# Patient Record
Sex: Male | Born: 1995 | Hispanic: Yes | Marital: Married | Smoking: Never smoker
Health system: Southern US, Community
[De-identification: ages and names within clinical notes are randomized; demographics above are authoritative.]

---
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
## 2014-01-13 ENCOUNTER — Emergency Department: Payer: Self-pay | Admitting: Emergency Medicine

## 2014-01-13 LAB — COMPREHENSIVE METABOLIC PANEL
ALBUMIN: 4.2 g/dL (ref 3.8–5.6)
Alkaline Phosphatase: 125 U/L — ABNORMAL HIGH
Anion Gap: 3 — ABNORMAL LOW (ref 7–16)
BILIRUBIN TOTAL: 0.6 mg/dL (ref 0.2–1.0)
BUN: 11 mg/dL (ref 7–18)
CHLORIDE: 105 mmol/L (ref 98–107)
CO2: 31 mmol/L (ref 21–32)
Calcium, Total: 9.1 mg/dL (ref 9.0–10.7)
Creatinine: 0.86 mg/dL (ref 0.60–1.30)
EGFR (African American): >60
EGFR (Non-African Amer.): >60
Glucose: 102 mg/dL — ABNORMAL HIGH (ref 65–99)
Osmolality: 277 (ref 275–301)
Potassium: 4 mmol/L (ref 3.5–5.1)
SGOT(AST): 18 U/L (ref 10–41)
SGPT (ALT): 21 U/L
Sodium: 139 mmol/L (ref 136–145)
TOTAL PROTEIN: 7.4 g/dL (ref 6.4–8.6)

## 2014-01-13 LAB — URINALYSIS, COMPLETE
BILIRUBIN, UR: NEGATIVE
BLOOD: NEGATIVE
Bacteria: NONE SEEN
Glucose,UR: NEGATIVE mg/dL (ref 0–75)
Ketone: NEGATIVE
Leukocyte Esterase: NEGATIVE
Nitrite: NEGATIVE
Ph: 6 (ref 4.5–8.0)
Protein: NEGATIVE
RBC, UR: NONE SEEN /HPF (ref 0–5)
SPECIFIC GRAVITY: 1.018 (ref 1.003–1.030)
Squamous Epithelial: NONE SEEN
WBC UR: NONE SEEN /HPF (ref 0–5)

## 2014-01-13 LAB — CBC WITH DIFFERENTIAL/PLATELET
BASOS ABS: 0.1 10*3/uL (ref 0.0–0.1)
BASOS PCT: 1.1 %
Eosinophil #: 0.2 10*3/uL (ref 0.0–0.7)
Eosinophil %: 3 %
HCT: 46.2 % (ref 40.0–52.0)
HGB: 15.7 g/dL (ref 13.0–18.0)
Lymphocyte #: 2 10*3/uL (ref 1.0–3.6)
Lymphocyte %: 40.2 %
MCH: 29 pg (ref 26.0–34.0)
MCHC: 33.9 g/dL (ref 32.0–36.0)
MCV: 86 fL (ref 80–100)
Monocyte #: 0.4 x10 3/mm (ref 0.2–1.0)
Monocyte %: 7.2 %
NEUTROS ABS: 2.4 10*3/uL (ref 1.4–6.5)
NEUTROS PCT: 48.5 %
PLATELETS: 250 10*3/uL (ref 150–440)
RBC: 5.4 10*6/uL (ref 4.40–5.90)
RDW: 12.9 % (ref 11.5–14.5)
WBC: 4.9 10*3/uL (ref 3.8–10.6)

## 2014-01-13 LAB — LIPASE, BLOOD: Lipase: 132 U/L (ref 73–393)

## 2014-05-28 IMAGING — CR DG KNEE COMPLETE 4+V*R*
1 series · 4 of 4 positions shown · non-contrast
Comparison: none

REASON FOR EXAM: knee pain  please fax result to [phone]
COMMENTS:

PROCEDURE:     DXR - DXR KNEE RT COMP WITH OBLIQUES  - December 27, 2011  [DATE]
RESULT:     Right knee images demonstrate no fracture, dislocation or
radiopaque foreign body.

[Series 1: t knee ap right · 0.14mm/px · 4 of 4 slices shown]
[im 1/4]
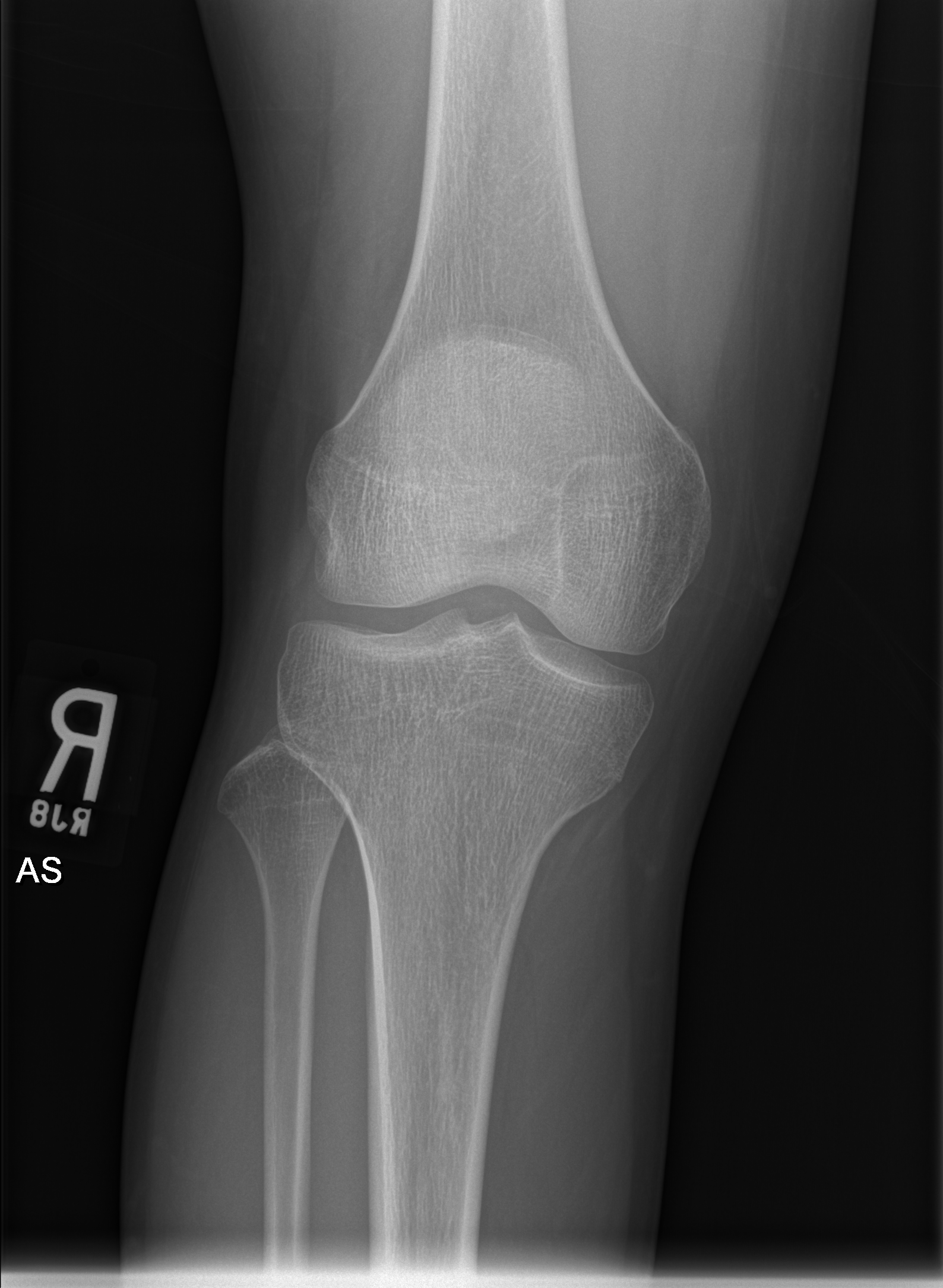
[im 2/4]
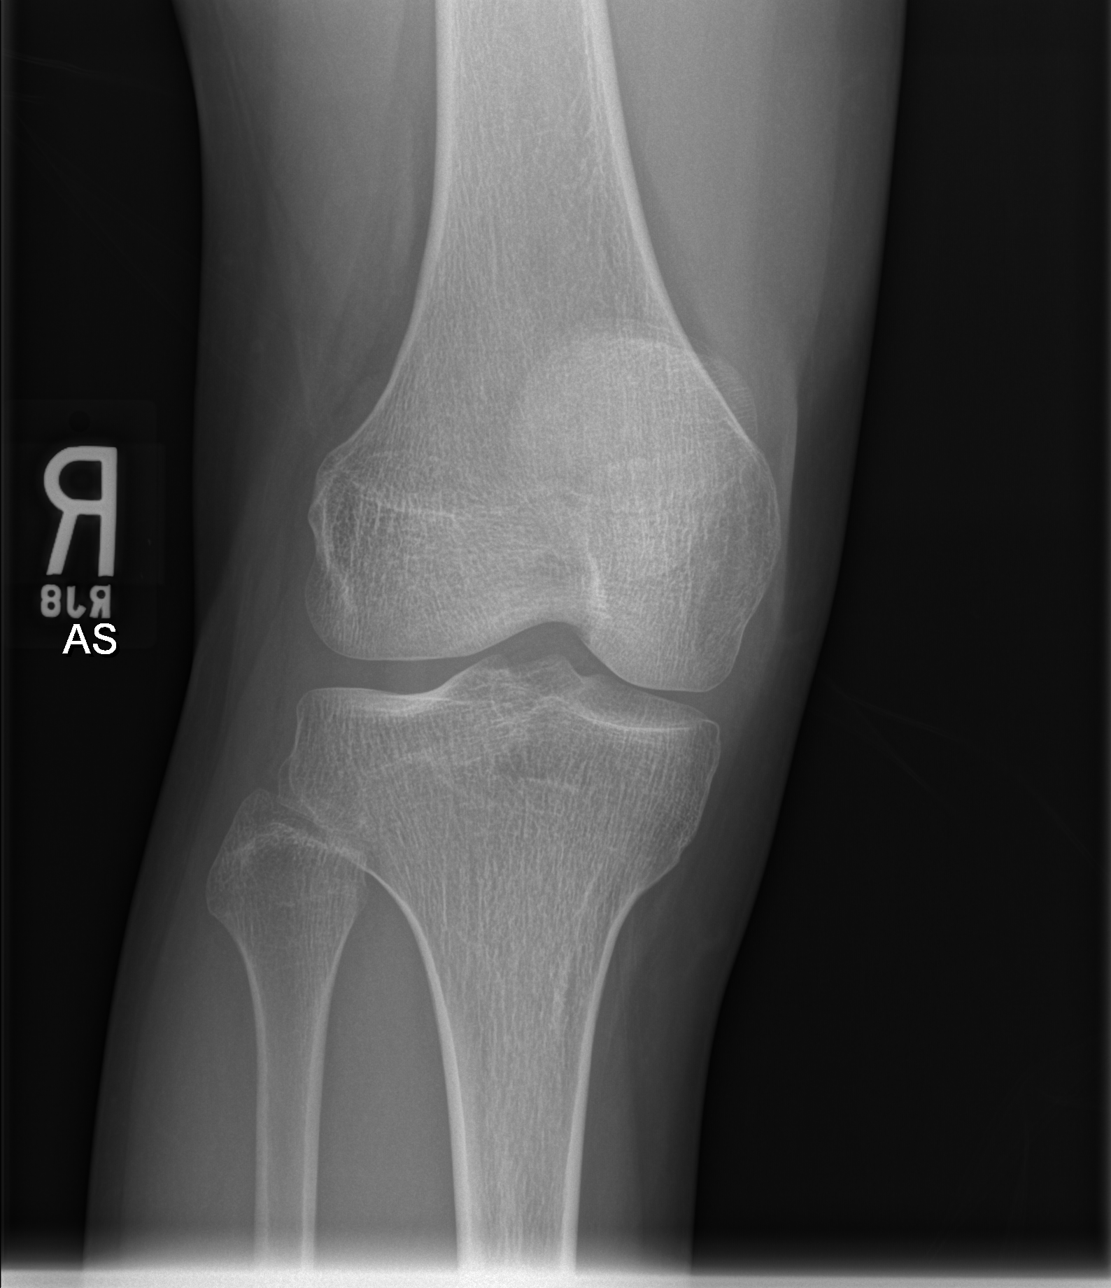
[im 3/4]
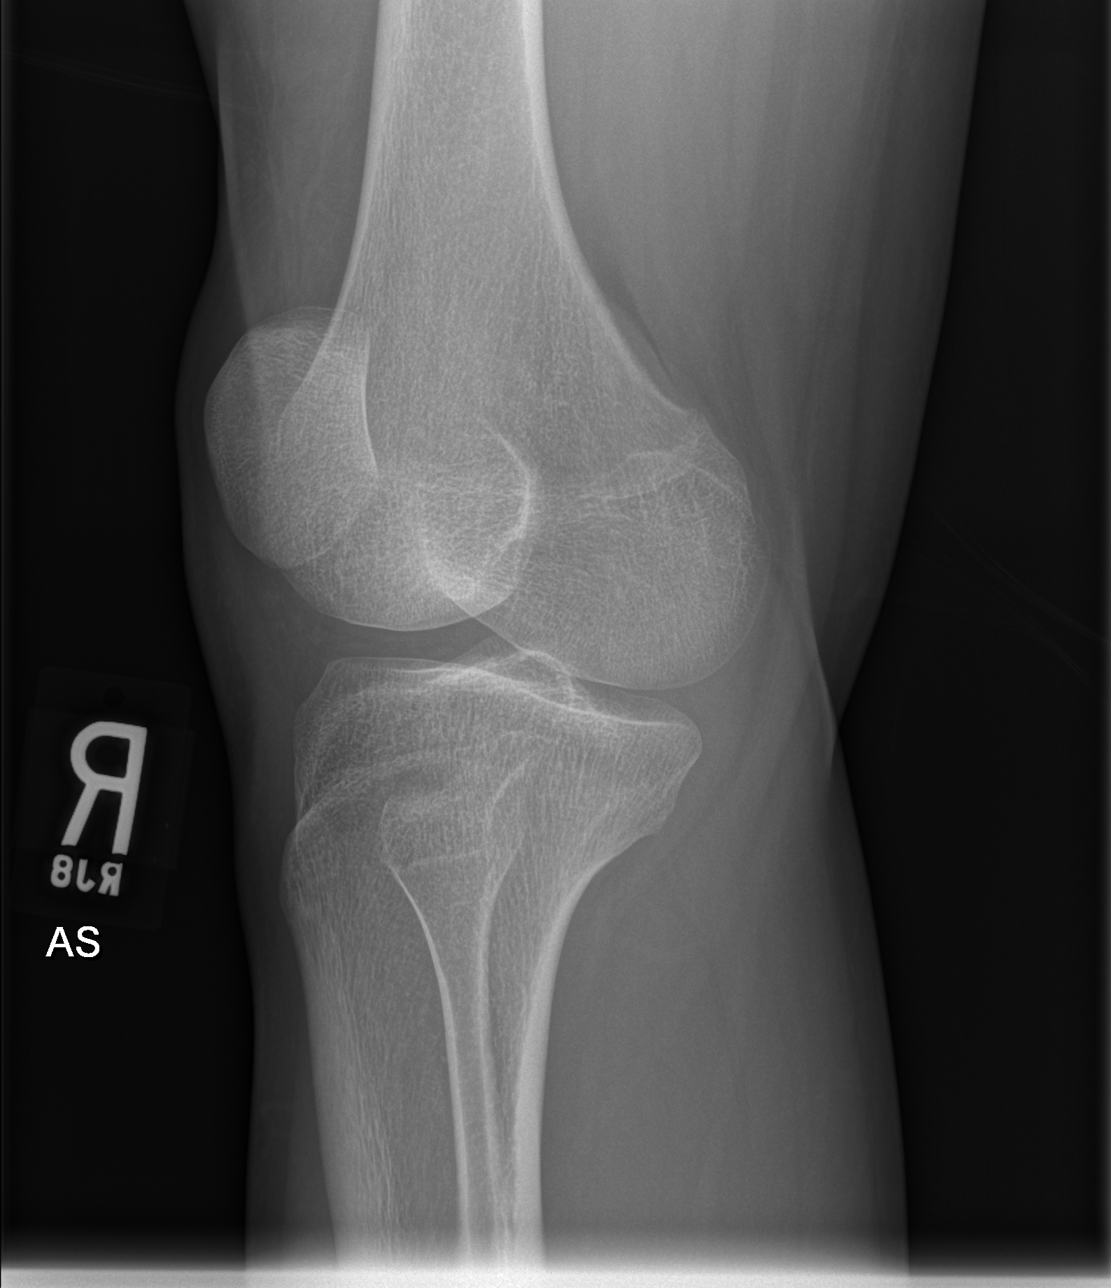
[im 4/4]
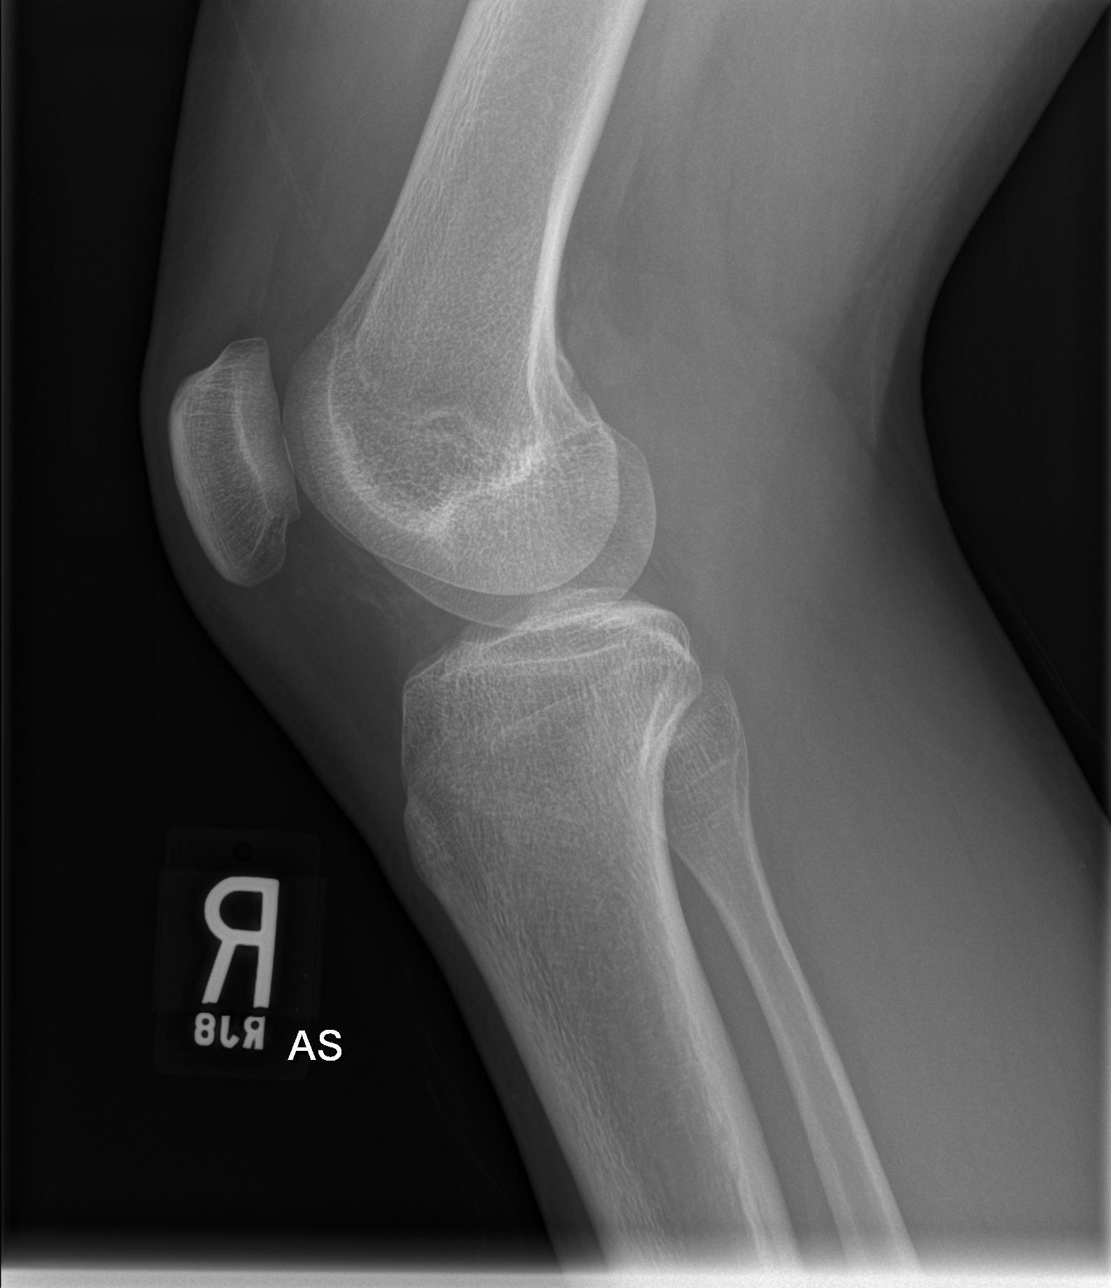

[4 of 4 positions shown; findings below may reference images not displayed]

IMPRESSION: Please see above.

[REDACTED]

## 2019-07-24 ENCOUNTER — Ambulatory Visit: Payer: Self-pay | Admitting: Dermatology

## 2021-11-23 ENCOUNTER — Ambulatory Visit: Payer: BC Managed Care – PPO | Admitting: Dermatology

## 2021-11-23 DIAGNOSIS — L80 Vitiligo: Secondary | ICD-10-CM | POA: Diagnosis not present

## 2021-11-23 MED ORDER — OPZELURA 1.5 % EX CREA: TOPICAL_CREAM | CUTANEOUS | 2 refills | Status: DC

## 2021-11-23 MED ORDER — CLOBETASOL PROPIONATE 0.05 % EX FOAM: Freq: Two times a day (BID) | CUTANEOUS | 2 refills | Status: DC

## 2021-11-23 NOTE — Progress Notes (Signed)
   Follow-Up Visit   Subjective  Jon Hamilton is a 26 y.o. male who presents for the following: Vitiligo (Knees, hands, elbows, feet x years. In the past he has used halobetasol foam, calcipotriene cream, Elidel cream, clobetasol, tacrolimus, and tofacitinib cream. He has also had Xtrac treatments in the past, but he doesn't think it helped much. ).   The following portions of the chart were reviewed this encounter and updated as appropriate:       Review of Systems:  No other skin or systemic complaints except as noted in HPI or Assessment and Plan.  Objective  Well appearing patient in no apparent distress; mood and affect are within normal limits.  A focused examination was performed including face, hands, knees, feet. Relevant physical exam findings are noted in the Assessment and Plan.  knees, wrists, feet, hands, elbows Depigmented patches                             Assessment & Plan  Vitiligo knees, wrists, feet, hands, elbows  Chronic and persistent condition with duration or expected duration over one year. Condition is symptomatic/ bothersome to patient. Not currently at goal.   Vitiligo is a chronic autoimmune condition which causes loss of skin pigment and is commonly seen on the face and may also involve areas of trauma like hands, elbows, knees, and ankles. There is no cure and it is difficult to treat.  Treatments include topical steroids and other topical anti-inflammatory ointments/creams and topical and oral Jak inhibitors.  Sometimes narrow band UV light therapy or Xtrac laser is helpful, both of which require twice weekly treatments for at least 3-6 months.  Antioxidant vitamins, such as Vitamins A,C,E,D, Folic Acid and B12 may be added to enhance treatment.  Start Clobetasol Foam Apply to AA qd vitiligo Mon-Fri, off on the weekends dsp 50g Start Opzelura Cream Apply to AA vitiligo QD, may increase to BID on the weekends dsp 60g Rxs sent  to [facility].  Topical steroids (such as triamcinolone, fluocinolone, fluocinonide, mometasone, clobetasol, halobetasol, betamethasone, hydrocortisone) can cause thinning and lightening of the skin if they are used for too long in the same area. Your physician has selected the right strength medicine for your problem and area affected on the body. Please use your medication only as directed by your physician to prevent side effects.   Discussed restarting Xtrac laser- pt defers, will start with topicals.   Related Medications clobetasol (OLUX) 0.05 % topical foam Apply topically 2 (two) times daily. Apply to affected areas vitiligo once a day Mon - Fri. Avoid face, groin, axilla.  Ruxolitinib Phosphate (OPZELURA) 1.5 % CREA Apply to vitiligo once a day, may increase to twice daily on the weekends.   Return in about 3 months (around 02/22/2022) for vitiligo.  ICherlyn Labella, CMA, am acting as scribe for Willeen Niece, MD .  Documentation: I have reviewed the above documentation for accuracy and completeness, and I agree with the above.  Willeen Niece MD

## 2021-11-23 NOTE — Patient Instructions (Addendum)
Vitiligo is a chronic autoimmune condition which causes loss of skin pigment and is commonly seen on the face and may also involve areas of trauma like hands, elbows, knees, and ankles. There is no cure and it is difficult to treat.  Treatments include topical steroids and other topical anti-inflammatory ointments/creams and topical and oral Jak inhibitors.  Sometimes narrow band UV light therapy or Xtrac laser is helpful, both of which require twice weekly treatments for at least 3-6 months.  Antioxidant vitamins, such as Vitamins A,C,E,D, Folic Acid and B12 may be added to enhance treatment.  Start Clobetasol foam Apply to vitiligo once a day Mon - Fri, off on the weekends. Avoid face, groin, underarms.  Topical steroids (such as triamcinolone, fluocinolone, fluocinonide, mometasone, clobetasol, halobetasol, betamethasone, hydrocortisone) can cause thinning and lightening of the skin if they are used for too long in the same area. Your physician has selected the right strength medicine for your problem and area affected on the body. Please use your medication only as directed by your physician to prevent side effects.   Start Opzelura Cream Apply to vitiligo once a day, may increase to twice a day on the weekends.   Due to recent changes in healthcare laws, you may see results of your pathology and/or laboratory studies on MyChart before the doctors have had a chance to review them. We understand that in some cases there may be results that are confusing or concerning to you. Please understand that not all results are received at the same time and often the doctors may need to interpret multiple results in order to provide you with the best plan of care or course of treatment. Therefore, we ask that you please give [place] 2 business days to thoroughly review all your results before contacting the office for clarification. Should we see a critical lab result, you will be contacted sooner.   If You Need  Anything After Your Visit  If you have any questions or concerns for your doctor, please call our main line at [phone] and press option 4 to reach your doctor's medical assistant. If no one answers, please leave a voicemail as directed and we will return your call as soon as possible. Messages left after 4 pm will be answered the following business day.   You may also send [place] a message via MyChart. We typically respond to MyChart messages within 1-2 business days.  For prescription refills, please ask your pharmacy to contact our office. Our fax number is [phone].  If you have an urgent issue when the clinic is closed that cannot wait until the next business day, you can page your doctor at the number below.    Please note that while we do our best to be available for urgent issues outside of office hours, we are not available 24/7.   If you have an urgent issue and are unable to reach [place], you may choose to seek medical care at your doctor's office, retail clinic, urgent care center, or emergency room.  If you have a medical emergency, please immediately call 911 or go to the emergency department.  Pager Numbers  - Dr. Gwen Pounds: [phone]  - Dr. Neale Burly: [phone]  - Dr. Roseanne Reno: [phone]  In the event of inclement weather, please call our main line at [phone] for an update on the status of any delays or closures.  Dermatology Medication Tips: Please keep the boxes that topical medications come in in order to help keep track of the  instructions about where and how to use these. Pharmacies typically print the medication instructions only on the boxes and not directly on the medication tubes.   If your medication is too expensive, please contact our office at [phone] option 4 or send [place] a message through MyChart.   We are unable to tell what your co-pay for medications will be in advance as this is different depending on your insurance coverage. However, we may  be able to find a substitute medication at lower cost or fill out paperwork to get insurance to cover a needed medication.   If a prior authorization is required to get your medication covered by your insurance company, please allow [place] 1-2 business days to complete this process.  Drug prices often vary depending on where the prescription is filled and some pharmacies may offer cheaper prices.  The website www.goodrx.com contains coupons for medications through different pharmacies. The prices here do not account for what the cost may be with help from insurance (it may be cheaper with your insurance), but the website can give you the price if you did not use any insurance.  - You can print the associated coupon and take it with your prescription to the pharmacy.  - You may also stop by our office during regular business hours and pick up a GoodRx coupon card.  - If you need your prescription sent electronically to a different pharmacy, notify our office through [facility] or by phone at [phone] option 4.     Si Usted Necesita Algo Despus de Su Visita  Tambin puede enviarnos un mensaje a travs de Clinical cytogeneticist. Por lo general respondemos a los mensajes de MyChart en el transcurso de 1 a 2 das hbiles.  Para renovar recetas, por favor pida a su farmacia que se ponga en contacto con nuestra oficina. Annie Sable de fax es [place] [phone].  Si tiene un asunto urgente cuando la clnica est cerrada y que no puede esperar hasta el siguiente da hbil, puede llamar/localizar a su doctor(a) al nmero que aparece a continuacin.   Por favor, tenga en cuenta que aunque hacemos todo lo posible para estar disponibles para asuntos urgentes fuera del horario de [place], no estamos disponibles las 24 horas del da, los 7 [address]  [address] de la [place].   Si tiene un problema urgente y no puede comunicarse con nosotros, puede optar por buscar atencin mdica  en el consultorio de su doctor(a), en una  clnica privada, en un centro de atencin urgente o en una sala de emergencias.  Si tiene Engineer, drilling, por favor llame inmediatamente al 911 o vaya a la sala de emergencias.  Nmeros de bper  - Dr. Gwen Pounds: [phone]  - Dra. Moye: [phone]  - Dra. Roseanne Reno: [phone]  En caso de inclemencias del [place], por favor llame a Lacy Duverney principal al [phone] para una actualizacin sobre el [place] de cualquier retraso o cierre.  Consejos para la medicacin en dermatologa: Por favor, guarde las cajas en las que vienen los medicamentos de uso tpico para ayudarle a seguir las instrucciones sobre dnde y cmo usarlos. Las farmacias generalmente imprimen las instrucciones del medicamento slo en las cajas y no directamente en los tubos del [place].   Si su medicamento es muy caro, por favor, pngase en contacto con Rolm Gala llamando al [phone] y presione la opcin 4 o envenos un mensaje a travs de Clinical cytogeneticist.   No podemos decirle cul ser su copago por los medicamentos por adelantado ya  que esto es diferente dependiendo de la cobertura de su seguro. Sin embargo, es posible que podamos encontrar un medicamento sustituto a Electrical engineer un formulario para que el seguro cubra el medicamento que se considera necesario.   Si se requiere una autorizacin previa para que su compaa de seguros [place] su medicamento, por favor permtanos de 1 a 2 das hbiles para completar este proceso.  Los precios de los medicamentos varan con frecuencia dependiendo del Environmental consultant de dnde se surte la receta y alguna farmacias pueden ofrecer precios ms baratos.  El sitio web www.goodrx.com tiene cupones para medicamentos de Airline pilot. Los precios aqu no tienen en cuenta lo que podra costar con la ayuda del seguro (puede ser ms barato con su seguro), pero el sitio web puede darle el precio si no utiliz Research scientist (physical sciences).  - Puede imprimir el cupn correspondiente y  llevarlo con su receta a la farmacia.  - Tambin puede pasar por nuestra oficina durante el horario de atencin regular y Charity fundraiser una tarjeta de cupones de GoodRx.  - Si necesita que su receta se enve electrnicamente a una farmacia diferente, informe a nuestra oficina a travs de MyChart de [place] o por telfono llamando al [phone] y presione la opcin 4.

## 2022-02-21 ENCOUNTER — Ambulatory Visit: Payer: BC Managed Care – PPO | Admitting: Dermatology

## 2022-03-30 ENCOUNTER — Other Ambulatory Visit: Payer: Self-pay

## 2022-03-30 ENCOUNTER — Other Ambulatory Visit: Payer: Self-pay | Admitting: Family Medicine

## 2022-03-30 DIAGNOSIS — N50811 Right testicular pain: Secondary | ICD-10-CM

## 2022-03-31 ENCOUNTER — Ambulatory Visit
Admission: RE | Admit: 2022-03-31 | Discharge: 2022-03-31 | Disposition: A | Discharge status: Home / Self Care | Payer: BC Managed Care – PPO | Source: Ambulatory Visit | Attending: Family Medicine | Admitting: Family Medicine

## 2022-03-31 DIAGNOSIS — N50811 Right testicular pain: Secondary | ICD-10-CM | POA: Diagnosis not present

## 2022-03-31 DIAGNOSIS — N50812 Left testicular pain: Secondary | ICD-10-CM | POA: Insufficient documentation

## 2022-04-10 ENCOUNTER — Ambulatory Visit: Payer: BC Managed Care – PPO | Admitting: Dermatology

## 2022-04-10 DIAGNOSIS — L80 Vitiligo: Secondary | ICD-10-CM | POA: Diagnosis not present

## 2022-04-10 NOTE — Patient Instructions (Addendum)
Clobetasol Foam - Apply to vitiligo once a day Mon-Fri. Avoid face, groin, underarms.  Topical steroids (such as triamcinolone, fluocinolone, fluocinonide, mometasone, clobetasol, halobetasol, betamethasone, hydrocortisone) can cause thinning and lightening of the skin if they are used for too long in the same area. Your physician has selected the right strength medicine for your problem and area affected on the body. Please use your medication only as directed by your physician to prevent side effects.   Opzelura Cream - Apply to vitiligo once a day, may increase to twice a day on weekends.  Vitiligo is a chronic autoimmune condition which causes loss of skin pigment and is commonly seen on the face and may also involve areas of trauma like hands, elbows, knees, and ankles. There is no cure and it is difficult to treat.  Treatments include topical steroids and other topical anti-inflammatory ointments/creams and topical and oral Jak inhibitors.  Sometimes narrow band UV light therapy or Xtrac laser is helpful, both of which require twice weekly treatments for at least 3-6 months.  Antioxidant vitamins, such as Vitamins A,C,E,D, Folic Acid and B12 may be added to enhance treatment. C E Alpha Lipoic   Due to recent changes in healthcare laws, you may see results of your pathology and/or laboratory studies on MyChart before the doctors have had a chance to review them. We understand that in some cases there may be results that are confusing or concerning to you. Please understand that not all results are received at the same time and often the doctors may need to interpret multiple results in order to provide you with the best plan of care or course of treatment. Therefore, we ask that you please give [place] 2 business days to thoroughly review all your results before contacting the office for clarification. Should we see a critical lab result, you will be contacted sooner.   If You Need Anything After Your  Visit  If you have any questions or concerns for your doctor, please call our main line at [phone] and press option 4 to reach your doctor's medical assistant. If no one answers, please leave a voicemail as directed and we will return your call as soon as possible. Messages left after 4 pm will be answered the following business day.   You may also send [place] a message via [place]. We typically respond to MyChart messages within 1-2 business days.  For prescription refills, please ask your pharmacy to contact our office. Our fax number is [phone].  If you have an urgent issue when the clinic is closed that cannot wait until the next business day, you can page your doctor at the number below.    Please note that while we do our best to be available for urgent issues outside of office hours, we are not available 24/7.   If you have an urgent issue and are unable to reach [place], you may choose to seek medical care at your doctor's office, retail clinic, urgent care center, or emergency room.  If you have a medical emergency, please immediately call 911 or go to the emergency department.  Pager Numbers  - Dr. Nehemiah Massed: [phone]  - Dr. Laurence Ferrari: [phone]  - Dr. Nicole Kindred: [phone]  In the event of inclement weather, please call our main line at [phone] for an update on the status of any delays or closures.  Dermatology Medication Tips: Please keep the boxes that topical medications come in in order to help keep track of the instructions about where  and how to use these. Pharmacies typically print the medication instructions only on the boxes and not directly on the medication tubes.   If your medication is too expensive, please contact our office at [phone] option 4 or send [place] a message through [place].   We are unable to tell what your co-pay for medications will be in advance as this is different depending on your insurance coverage. However, we may be able to find a  substitute medication at lower cost or fill out paperwork to get insurance to cover a needed medication.   If a prior authorization is required to get your medication covered by your insurance company, please allow [place] 1-2 business days to complete this process.  Drug prices often vary depending on where the prescription is filled and some pharmacies may offer cheaper prices.  The website www.goodrx.com contains coupons for medications through different pharmacies. The prices here do not account for what the cost may be with help from insurance (it may be cheaper with your insurance), but the website can give you the price if you did not use any insurance.  - You can print the associated coupon and take it with your prescription to the pharmacy.  - You may also stop by our office during regular business hours and pick up a GoodRx coupon card.  - If you need your prescription sent electronically to a different pharmacy, notify our office through [facility] or by phone at [phone] option 4.     Si Usted Necesita Algo Despus de Su Visita  Tambin puede enviarnos un mensaje a travs de Pharmacist, community. Por lo general respondemos a los mensajes de MyChart en el transcurso de 1 a 2 das hbiles.  Para renovar recetas, por favor pida a su farmacia que se ponga en contacto con nuestra oficina. Harland Dingwall de fax es [place] [phone].  Si tiene un asunto urgente cuando la clnica est cerrada y que no puede esperar hasta el siguiente da hbil, puede llamar/localizar a su doctor(a) al nmero que aparece a continuacin.   Por favor, tenga en cuenta que aunque hacemos todo lo posible para estar disponibles para asuntos urgentes fuera del horario de [place], no estamos disponibles las 24 horas del da, los 7 das de la [place].   Si tiene un problema urgente y no puede comunicarse con nosotros, puede optar por buscar atencin mdica  en el consultorio de su doctor(a), en una clnica privada, en un  centro de atencin urgente o en una sala de emergencias.  Si tiene Engineering geologist, por favor llame inmediatamente al 911 o vaya a la sala de emergencias.  Nmeros de bper  - Dr. Nehemiah Massed: [phone]  - Dra. Moye: [phone]  - Dra. Nicole Kindred: [phone]  En caso de inclemencias del [place], por favor llame a Johnsie Kindred principal al [phone] para una actualizacin sobre el [place] de cualquier retraso o cierre.  Consejos para la medicacin en dermatologa: Por favor, guarde las cajas en las que vienen los medicamentos de uso tpico para ayudarle a seguir las instrucciones sobre dnde y cmo usarlos. Las farmacias generalmente imprimen las instrucciones del medicamento slo en las cajas y no directamente en los tubos del [place].   Si su medicamento es muy caro, por favor, pngase en contacto con Zigmund Daniel llamando al [phone] y presione la opcin 4 o envenos un mensaje a travs de Pharmacist, community.   No podemos decirle cul ser su copago por los medicamentos por adelantado ya que esto es  diferente dependiendo de la cobertura de su seguro. Sin embargo, es posible que podamos encontrar un medicamento sustituto a Electrical engineer un formulario para que el seguro cubra el medicamento que se considera necesario.   Si se requiere una autorizacin previa para que su compaa de seguros [place] su medicamento, por favor permtanos de 1 a 2 das hbiles para completar este proceso.  Los precios de los medicamentos varan con frecuencia dependiendo del Environmental consultant de dnde se surte la receta y alguna farmacias pueden ofrecer precios ms baratos.  El sitio web www.goodrx.com tiene cupones para medicamentos de Airline pilot. Los precios aqu no tienen en cuenta lo que podra costar con la ayuda del seguro (puede ser ms barato con su seguro), pero el sitio web puede darle el precio si no utiliz Research scientist (physical sciences).  - Puede imprimir el cupn correspondiente y llevarlo con su receta  a la farmacia.  - Tambin puede pasar por nuestra oficina durante el horario de atencin regular y Charity fundraiser una tarjeta de cupones de GoodRx.  - Si necesita que su receta se enve electrnicamente a una farmacia diferente, informe a nuestra oficina a travs de MyChart de [place] o por telfono llamando al [phone] y presione la opcin 4.

## 2022-04-10 NOTE — Progress Notes (Signed)
   Follow-Up Visit   Subjective  Jon Hamilton is a 27 y.o. male who presents for the following: Follow-up.  Patient presents for 4 month follow-up vitiligo of the hands, arms, legs, feet. Thinks he's doing a little better.  The following portions of the chart were reviewed this encounter and updated as appropriate:       Review of Systems:  No other skin or systemic complaints except as noted in HPI or Assessment and Plan.  Objective  Well appearing patient in no apparent distress; mood and affect are within normal limits.  A focused examination was performed including face, arms, legs. Relevant physical exam findings are noted in the Assessment and Plan.  knees, wrists, feet, hands, elbows Depigmented patches with improvement when compared to photos, knees and elbows patches are smaller, hands patches not as white.    Assessment & Plan  Vitiligo knees, wrists, feet, hands, elbows  Chronic and persistent condition with duration or expected duration over one year. Condition is symptomatic / bothersome to patient. Not to goal, but improving.  Vitiligo is a chronic autoimmune condition which causes loss of skin pigment and is commonly seen on the face and may also involve areas of trauma like hands, elbows, knees, and ankles. There is no cure and it is difficult to treat.  Treatments include topical steroids and other topical anti-inflammatory ointments/creams and topical and oral Jak inhibitors.  Sometimes narrow band UV light therapy or Xtrac laser is helpful, both of which require twice weekly treatments for at least 3-6 months.  Antioxidant vitamins, such as Vitamins A,C,E,D, Folic Acid and B12 may be added to enhance treatment.  Continue Clobetasol Foam Apply to AA qd vitiligo Mon-Fri, off on the weekends. Contiue Opzelura Cream Apply to AA vitiligo QD, may increase to BID on the weekends.  Topical steroids (such as triamcinolone, fluocinolone, fluocinonide, mometasone,  clobetasol, halobetasol, betamethasone, hydrocortisone) can cause thinning and lightening of the skin if they are used for too long in the same area. Your physician has selected the right strength medicine for your problem and area affected on the body. Please use your medication only as directed by your physician to prevent side effects.     Related Medications clobetasol (OLUX) 0.05 % topical foam Apply topically 2 (two) times daily. Apply to affected areas vitiligo once a day Mon - Fri. Avoid face, groin, axilla.  Ruxolitinib Phosphate (OPZELURA) 1.5 % CREA Apply to vitiligo once a day, may increase to twice daily on the weekends.   Return in about 4 months (around 08/09/2022) for vitiligo.  IJamesetta Orleans, CMA, am acting as scribe for Brendolyn Patty, MD .  Documentation: I have reviewed the above documentation for accuracy and completeness, and I agree with the above.  Brendolyn Patty MD

## 2022-04-28 ENCOUNTER — Encounter: Payer: Self-pay | Admitting: Urology

## 2022-04-28 ENCOUNTER — Ambulatory Visit: Payer: BC Managed Care – PPO | Admitting: Urology

## 2022-04-28 VITALS — BP 133/76 | HR 109 | Ht 70.0 in | Wt 212.0 lb

## 2022-04-28 DIAGNOSIS — N5082 Scrotal pain: Secondary | ICD-10-CM

## 2022-04-28 LAB — URINALYSIS, COMPLETE
Bilirubin, UA: NEGATIVE
Glucose, UA: NEGATIVE
Ketones, UA: NEGATIVE
Leukocytes,UA: NEGATIVE
Nitrite, UA: NEGATIVE
Protein,UA: NEGATIVE
RBC, UA: NEGATIVE
Specific Gravity, UA: 1.02 (ref 1.005–1.030)
Urobilinogen, Ur: 0.2 mg/dL (ref 0.2–1.0)
pH, UA: 5.5 (ref 5.0–7.5)

## 2022-04-28 LAB — MICROSCOPIC EXAMINATION: Bacteria, UA: NONE SEEN

## 2022-04-28 MED ORDER — TAMSULOSIN HCL 0.4 MG PO CAPS: 0.4000 mg | ORAL_CAPSULE | Freq: Every day | ORAL | 1 refills | Status: DC

## 2022-04-28 NOTE — Progress Notes (Signed)
   04/28/2022 8:55 AM   Jon Hamilton 1995/06/09 [id number]  Referring provider: Donnamarie Rossetti, PA-C [place],  [place] [address]  Chief Complaint  Patient presents with   Testicle Pain    HPI: Jon Hamilton is a 27 y.o. male referred for evaluation of scrotal pain  2 month history of bilateral scrotal pain which he describes as a dull pressure sensation Symptoms are intermittent and he estimates they occur every other day and typically last ~ 2 hours He does note discomfort for a few hours after intercourse.  No bothersome LUTS Does have dull suprapubic sensation Was seen [facility] 03/28/2022.  Urinalysis was unremarkable. Scrotal sonogram ordered and performed 03/31/2022 which showed normal-appearing testes bilaterally.  Left varicocele was present.  Doppler flow normal bilaterally  PMH: No past medical history on file.  Surgical History: Negative  Home Medications:  Allergies as of 04/28/2022       Reactions   Penicillins Hives        Medication List        Accurate as of April 28, 2022  8:55 AM. If you have any questions, ask your nurse or doctor.          clobetasol 0.05 % topical foam Commonly known as: OLUX Apply topically 2 (two) times daily. Apply to affected areas vitiligo once a day Mon - Fri. Avoid face, groin, axilla.   Opzelura 1.5 % Crea Generic drug: Ruxolitinib Phosphate Apply to vitiligo once a day, may increase to twice daily on the weekends.        Allergies:  Allergies  Allergen Reactions   Penicillins Hives    Family History: No family history on file.  Social History:  has no history on file for tobacco use, alcohol use, and drug use.   Physical Exam: BP 133/76   Pulse (!) 109   Ht 5\' 10"  (1.778 m)   Wt 212 lb (96.2 kg)   BMI 30.42 kg/m   Constitutional:  Alert and oriented, No acute distress. HEENT: [place] AT GU: Phallus uncircumcised without lesions.  Testes descended bilaterally without  masses or tenderness.  Small left varicocele palpable.  Otherwise spermatic cord/epididymis palpably normal bilaterally Skin: No rashes, bruises or suspicious lesions. Neurologic: Grossly intact, no focal deficits, moving all 4 extremities. Psychiatric: Normal mood and affect.   Pertinent Imaging: Ultrasound images were personally reviewed and interpreted  Assessment & Plan:    1.  Scrotal content pain No significant abnormalities on scrotal ultrasound.  We discussed small varicoceles typically do not cause pain and his symptoms are not typical of varicocele related pain.  We discussed scrotal pain is common and in most cases the exact etiology is not determined.  He does note pain after intercourse and we discussed the possibility of referred pain secondary to inflammatory prostatitis Trial tamsulosin 0.4 mg daily Follow-up prn   Abbie Sons, MD  [place] [address] [place], [place] [address] [phone]

## 2022-08-22 ENCOUNTER — Ambulatory Visit: Payer: BC Managed Care – PPO | Admitting: Dermatology

## 2022-08-22 VITALS — BP 148/90

## 2022-08-22 DIAGNOSIS — L80 Vitiligo: Secondary | ICD-10-CM

## 2022-08-22 MED ORDER — OPZELURA 1.5 % EX CREA: 1.0000 | TOPICAL_CREAM | Freq: Every day | CUTANEOUS | 6 refills | Status: DC

## 2022-08-22 NOTE — Patient Instructions (Addendum)
Clobetasol cream 3 days a week to hands, elbows, knees, avoid face, groin, axilla Cont Opzelura cream once daily to hands, elbows, knees  Your prescription was sent to [facility] in [place]. A representative from [facility] will contact you within 3 business hours to verify your address and insurance information to schedule a free delivery. If for any reason you do not receive a phone call from them, please reach out to them. Their phone number is [phone] and their hours are Monday-Friday 9:00 am-5:00 pm.      Due to recent changes in healthcare laws, you may see results of your pathology and/or laboratory studies on MyChart before the doctors have had a chance to review them. We understand that in some cases there may be results that are confusing or concerning to you. Please understand that not all results are received at the same time and often the doctors may need to interpret multiple results in order to provide you with the best plan of care or course of treatment. Therefore, we ask that you please give [place] 2 business days to thoroughly review all your results before contacting the office for clarification. Should we see a critical lab result, you will be contacted sooner.   If You Need Anything After Your Visit  If you have any questions or concerns for your doctor, please call our main line at [phone] and press option 4 to reach your doctor's medical assistant. If no one answers, please leave a voicemail as directed and we will return your call as soon as possible. Messages left after 4 pm will be answered the following business day.   You may also send [place] a message via MyChart. We typically respond to MyChart messages within 1-2 business days.  For prescription refills, please ask your pharmacy to contact our office. Our fax number is [phone].  If you have an urgent issue when the clinic is closed that cannot wait until the next business day, you can page your  doctor at the number below.    Please note that while we do our best to be available for urgent issues outside of office hours, we are not available 24/7.   If you have an urgent issue and are unable to reach [place], you may choose to seek medical care at your doctor's office, retail clinic, urgent care center, or emergency room.  If you have a medical emergency, please immediately call 911 or go to the emergency department.  Pager Numbers  - Dr. Gwen Pounds: [phone]  - Dr. Neale Burly: [phone]  - Dr. Roseanne Reno: [phone]  In the event of inclement weather, please call our main line at [phone] for an update on the status of any delays or closures.  Dermatology Medication Tips: Please keep the boxes that topical medications come in in order to help keep track of the instructions about where and how to use these. Pharmacies typically print the medication instructions only on the boxes and not directly on the medication tubes.   If your medication is too expensive, please contact our office at [phone] option 4 or send [place] a message through MyChart.   We are unable to tell what your co-pay for medications will be in advance as this is different depending on your insurance coverage. However, we may be able to find a substitute medication at lower cost or fill out paperwork to get insurance to cover a needed medication.   If a prior authorization is required to get your medication covered by  your insurance company, please allow [place] 1-2 business days to complete this process.  Drug prices often vary depending on where the prescription is filled and some pharmacies may offer cheaper prices.  The website www.goodrx.com contains coupons for medications through different pharmacies. The prices here do not account for what the cost may be with help from insurance (it may be cheaper with your insurance), but the website can give you the price if you did not use any insurance.  - You can print  the associated coupon and take it with your prescription to the pharmacy.  - You may also stop by our office during regular business hours and pick up a GoodRx coupon card.  - If you need your prescription sent electronically to a different pharmacy, notify our office through [facility] or by phone at [phone] option 4.     Si Usted Necesita Algo Despus de Su Visita  Tambin puede enviarnos un mensaje a travs de Clinical cytogeneticist. Por lo general respondemos a los mensajes de MyChart en el transcurso de 1 a 2 das hbiles.  Para renovar recetas, por favor pida a su farmacia que se ponga en contacto con nuestra oficina. Annie Sable de fax es [place] [phone].  Si tiene un asunto urgente cuando la clnica est cerrada y que no puede esperar hasta el siguiente da hbil, puede llamar/localizar a su doctor(a) al nmero que aparece a continuacin.   Por favor, tenga en cuenta que aunque hacemos todo lo posible para estar disponibles para asuntos urgentes fuera del horario de [place], no estamos disponibles las 24 horas del da, los 7 [address]  [address] de la [place].   Si tiene un problema urgente y no puede comunicarse con nosotros, puede optar por buscar atencin mdica  en el consultorio de su doctor(a), en una clnica privada, en un centro de atencin urgente o en una sala de emergencias.  Si tiene Engineer, drilling, por favor llame inmediatamente al 911 o vaya a la sala de emergencias.  Nmeros de bper  - Dr. Gwen Pounds: [phone]  - Dra. Moye: [phone]  - Dra. Roseanne Reno: [phone]  En caso de inclemencias del [place], por favor llame a Lacy Duverney principal al [phone] para una actualizacin sobre el [place] de cualquier retraso o cierre.  Consejos para la medicacin en dermatologa: Por favor, guarde las cajas en las que vienen los medicamentos de uso tpico para ayudarle a seguir las instrucciones sobre dnde y cmo usarlos. Las farmacias generalmente imprimen las  instrucciones del medicamento slo en las cajas y no directamente en los tubos del [place].   Si su medicamento es muy caro, por favor, pngase en contacto con Rolm Gala llamando al [phone] y presione la opcin 4 o envenos un mensaje a travs de Clinical cytogeneticist.   No podemos decirle cul ser su copago por los medicamentos por adelantado ya que esto es diferente dependiendo de la cobertura de su seguro. Sin embargo, es posible que podamos encontrar un medicamento sustituto a Audiological scientist un formulario para que el seguro cubra el medicamento que se considera necesario.   Si se requiere una autorizacin previa para que su compaa de seguros [place] su medicamento, por favor permtanos de 1 a 2 das hbiles para completar [address]  Los precios de los medicamentos varan con frecuencia dependiendo del Environmental consultant de dnde se surte la receta y alguna farmacias pueden ofrecer precios ms baratos.  El sitio web www.goodrx.com tiene cupones para medicamentos de Health and safety inspector. Los precios aqu no tienen  en cuenta lo que podra costar con la ayuda del seguro (puede ser ms barato con su seguro), pero el sitio web puede darle el precio si no Visual merchandiser.  - Puede imprimir el cupn correspondiente y llevarlo con su receta a la farmacia.  - Tambin puede pasar por nuestra oficina durante el horario de atencin regular y Education officer, museum una tarjeta de cupones de GoodRx.  - Si necesita que su receta se enve electrnicamente a una farmacia diferente, informe a nuestra oficina a travs de MyChart de [place] o por telfono llamando al [phone] y presione la opcin 4.

## 2022-08-22 NOTE — Progress Notes (Signed)
   Follow-Up Visit   Subjective  Jon Hamilton is a 27 y.o. male who presents for the following: Vitiligo 37m f/u, knees, wrists, feet, hands, elbows, clobetasol foam 2-3d/wk, Opzelura cr qd, sees some improvement elbows, legs   The following portions of the chart were reviewed this encounter and updated as appropriate: medications, allergies, medical history  Review of Systems:  No other skin or systemic complaints except as noted in HPI or Assessment and Plan.  Objective  Well appearing patient in no apparent distress; mood and affect are within normal limits.   A focused examination was performed of the following areas: Hands, arms, feet, elbows, knees  Relevant exam findings are noted in the Assessment and Plan.    Assessment & Plan   VITILIGO Exam: depigmented patches on hands, elbows, wrist, knees, with repigmentation noted on elbows/knees when compared to photos  Chronic and persistent condition with duration or expected duration over one year. Condition is symptomatic/ bothersome to patient. Some improvement but not currently at goal.   Vitiligo is a chronic autoimmune condition which causes loss of skin pigment and is commonly seen on the face and may also involve areas of trauma like hands, elbows, knees, and ankles. There is no cure and it is difficult to treat.  Treatments include topical steroids and other topical anti-inflammatory ointments/creams and topical and oral Jak inhibitors.  Sometimes narrow band UV light therapy or Xtrac laser is helpful, both of which require twice weekly treatments for at least 3-6 months.  Antioxidant vitamins, such as Vitamins A,C,E,D, Folic Acid and B12 may be added to enhance treatment. Heliocare may also enhance treatment results.  Treatment Plan: Cont Opzelura cr qd to aa hands, elbows, knees, rfs sent in Cont Clobetasol cr 3d/wk aa hands, elbows knees, Caution skin atrophy with long-term use. Will check insurance coverage for [facility]  laser treatment.  Topical steroids (such as triamcinolone, fluocinolone, fluocinonide, mometasone, clobetasol, halobetasol, betamethasone, hydrocortisone) can cause thinning and lightening of the skin if they are used for too long in the same area. Your physician has selected the right strength medicine for your problem and area affected on the body. Please use your medication only as directed by your physician to prevent side effects.    Return in about 6 months (around 02/21/2023) for vitiligo.  I, Ardis Rowan, RMA, am acting as scribe for Willeen Niece, MD .   Documentation: I have reviewed the above documentation for accuracy and completeness, and I agree with the above.  Willeen Niece, MD

## 2022-08-24 ENCOUNTER — Telehealth: Payer: Self-pay

## 2022-08-24 NOTE — Telephone Encounter (Addendum)
Xtrac laster not covered. [employer] does not cover vitiligo diagnosis. Patient advised. aw

## 2022-10-11 ENCOUNTER — Ambulatory Visit: Payer: BC Managed Care – PPO | Admitting: Urology

## 2022-10-11 VITALS — BP 115/78 | HR 76 | Ht 70.0 in | Wt 217.0 lb

## 2022-10-11 DIAGNOSIS — N5082 Scrotal pain: Secondary | ICD-10-CM | POA: Diagnosis not present

## 2022-10-11 LAB — URINALYSIS, COMPLETE
Bilirubin, UA: NEGATIVE
Glucose, UA: NEGATIVE
Ketones, UA: NEGATIVE
Leukocytes,UA: NEGATIVE
Nitrite, UA: NEGATIVE
Protein,UA: NEGATIVE
RBC, UA: NEGATIVE
Specific Gravity, UA: 1.015 (ref 1.005–1.030)
Urobilinogen, Ur: 0.2 mg/dL (ref 0.2–1.0)
pH, UA: 6 (ref 5.0–7.5)

## 2022-10-11 LAB — MICROSCOPIC EXAMINATION

## 2022-10-11 MED ORDER — TAMSULOSIN HCL 0.4 MG PO CAPS: 0.4000 mg | ORAL_CAPSULE | Freq: Every day | ORAL | 11 refills | Status: DC

## 2022-10-11 NOTE — Progress Notes (Signed)
   I, Duke Salvia, acting as a Neurosurgeon for Jon Altes, MD., have documented all relevant documentation on the behalf of Jon Altes, MD, as directed by  Jon Altes, MD while in the presence of Jon Altes, MD.  10/11/2022 9:56 AM   Jon Hamilton 1995/09/02 [id number]   Chief Complaint  Patient presents with   Testicle Pain    HPI: Jon Hamilton is a 27 y.o. male presenting for follow up of scrotal pain  Initially seen 04/28/22 with a 2 month history of bilateral scrotal pain.  He was given a trial of Tamsulosin for the possibility of of referred scrotal pain from prostatic inflammation. He took this medication for one month and did see improvement in his symptoms.  He continues to have intermittent pain, however, appears to be aisolated to within 1-2 days after intercourse/ejaculation an consists of bilateral scrotal aching, which can be increased intensity at times. He also has mild pelvic discomfort. No bothersome LUTS. Denies dysuria or gross hematuria.   Home Medications:  Allergies as of 10/11/2022       Reactions   Penicillins Hives        Medication List        Accurate as of October 11, 2022  9:56 AM. If you have any questions, ask your nurse or doctor.          clobetasol 0.05 % topical foam Commonly known as: OLUX Apply topically 2 (two) times daily. Apply to affected areas vitiligo once a day Mon - Fri. Avoid face, groin, axilla.   Opzelura 1.5 % Crea Generic drug: Ruxolitinib Phosphate Apply 1 Application topically daily. Qd to aa hands, elbows, knees for vitiligo   tamsulosin 0.4 MG Caps capsule Commonly known as: FLOMAX Take 1 capsule (0.4 mg total) by mouth daily.        Allergies:  Allergies  Allergen Reactions   Penicillins Hives     Physical Exam: BP 115/78   Pulse 76   Ht 5\' 10"  (1.778 m)   Wt 217 lb (98.4 kg)   BMI 31.14 kg/m   Constitutional:  Alert and oriented, No acute distress. HEENT:  AT GU: Prostate 25  g, boggy, nontender. No pelvic floor tenderness noted. Skin: No rashes, bruises or suspicious lesions. Neurologic: Grossly intact, no focal deficits, moving all 4 extremities. Psychiatric: Normal mood and affect.    Assessment & Plan:    1. Bilateral scrotal pain Associated within 1-2 days after intercourse and most likely referred pain secondary to prostatic inflammation. Tamsulosin did improve his symptoms when taken regularly. Will have him take Tamsulosin 0.4 mg daily x3 days to start after ejaculation. Follow up in 6 weeks for reassessment.        [facility] [address], [address] [place], [place] [address] [phone]

## 2022-10-12 ENCOUNTER — Encounter: Payer: Self-pay | Admitting: Urology

## 2022-11-22 ENCOUNTER — Ambulatory Visit: Payer: BC Managed Care – PPO | Admitting: Urology

## 2022-11-22 ENCOUNTER — Encounter: Payer: Self-pay | Admitting: Urology

## 2022-11-22 VITALS — BP 104/72 | HR 65 | Ht 70.0 in | Wt 210.0 lb

## 2022-11-22 DIAGNOSIS — N5082 Scrotal pain: Secondary | ICD-10-CM | POA: Diagnosis not present

## 2022-11-22 NOTE — Progress Notes (Signed)
   I, Jon Hamilton, acting as a Neurosurgeon for Jon Altes, MD., have documented all relevant documentation on the behalf of Jon Altes, MD, as directed by  Jon Altes, MD while in the presence of Jon Altes, MD.  11/22/2022 11:29 AM   Jon Hamilton November 30, 1995 [id number]   Chief Complaint  Patient presents with   Follow-up    6 week follow up    HPI: Jon Hamilton is a 27 y.o. male presenting for follow up of scrotal pain  Refer to note from previous office visit, 10/11/22. He has varied when he takes the Tamsulosin and seems to work well if he takes after intercourse. He was out of town last week traveling and doing increased walking and did note mild scrotal discomfort at that time. Overall, his scrotal pain has improved.   Home Medications:  Allergies as of 11/22/2022       Reactions   Penicillins Hives        Medication List        Accurate as of November 22, 2022 11:29 AM. If you have any questions, ask your nurse or doctor.          clobetasol 0.05 % topical foam Commonly known as: OLUX Apply topically 2 (two) times daily. Apply to affected areas vitiligo once a day Mon - Fri. Avoid face, groin, axilla.   Opzelura 1.5 % Crea Generic drug: Ruxolitinib Phosphate Apply 1 Application topically daily. Qd to aa hands, elbows, knees for vitiligo   tamsulosin 0.4 MG Caps capsule Commonly known as: FLOMAX Take 1 capsule (0.4 mg total) by mouth daily.        Allergies:  Allergies  Allergen Reactions   Penicillins Hives     Physical Exam: BP 104/72   Pulse 65   Ht 5\' 10"  (1.778 m)   Wt 210 lb (95.3 kg)   BMI 30.13 kg/m   Constitutional:  Alert and oriented, No acute distress. HEENT: Jon Hamilton AT Psychiatric: Normal mood and affect.    Assessment & Plan:    1. Scrotal pain Improved onTamsulosin and his pain, most likely referral to PT for pelvic floor physical therapy. However, he states based on symptom improvement, he does not desire  to pursue at this time. Continue Tamsulosin as needed. 1 year follow up and call earlier for worsening symptoms.  I have reviewed the above documentation for accuracy and completeness, and I agree with the above.   Jon Altes, MD  [facility] [address] [place], [place] [address] [phone]

## 2022-11-23 ENCOUNTER — Encounter: Payer: Self-pay | Admitting: Urology

## 2022-12-15 ENCOUNTER — Other Ambulatory Visit: Payer: Self-pay | Admitting: Dermatology

## 2022-12-15 DIAGNOSIS — L80 Vitiligo: Secondary | ICD-10-CM

## 2022-12-29 ENCOUNTER — Other Ambulatory Visit: Payer: Self-pay | Admitting: Family Medicine

## 2022-12-29 DIAGNOSIS — R748 Abnormal levels of other serum enzymes: Secondary | ICD-10-CM

## 2023-01-17 ENCOUNTER — Ambulatory Visit
Admission: RE | Admit: 2023-01-17 | Discharge: 2023-01-17 | Disposition: A | Discharge status: Home / Self Care | Payer: BC Managed Care – PPO | Source: Ambulatory Visit | Attending: Family Medicine | Admitting: Family Medicine

## 2023-01-17 DIAGNOSIS — R748 Abnormal levels of other serum enzymes: Secondary | ICD-10-CM | POA: Diagnosis present

## 2023-02-20 ENCOUNTER — Ambulatory Visit: Payer: BC Managed Care – PPO | Admitting: Dermatology

## 2023-06-04 ENCOUNTER — Ambulatory Visit: Payer: BC Managed Care – PPO | Admitting: Dermatology

## 2023-06-04 DIAGNOSIS — L80 Vitiligo: Secondary | ICD-10-CM

## 2023-06-04 NOTE — Progress Notes (Signed)
   Follow-Up Visit   Subjective  Jon Hamilton is a 28 y.o. male who presents for the following: Vitiligo 75m follow up, knees, wrists, feet, hands, arms, elbows using clobetasol topical foam and opzelura cream. Pt reprots improvement at elbows and knees.  The patient has spots, moles and lesions to be evaluated, some may be new or changing and the patient may have concern these could be cancer.   The following portions of the chart were reviewed this encounter and updated as appropriate: medications, allergies, medical history  Review of Systems:  No other skin or systemic complaints except as noted in HPI or Assessment and Plan.  Objective  Well appearing patient in no apparent distress; mood and affect are within normal limits.   A focused examination was performed of the following areas: Hands, arms, feet, elbows, knees   Relevant exam findings are noted in the Assessment and Plan.    Assessment & Plan   VITILIGO Exam: Improvement at elbows, bilateral knees compared to photos with significant repigmentation. Patient defers exam of feet. Fingers show slight improvement when compared to photos.    Chronic and persistent condition with duration or expected duration over one year. Condition is symptomatic/ bothersome to patient. Some improvement but not currently at goal.    Vitiligo is a chronic autoimmune condition which causes loss of skin pigment and is commonly seen on the face and may also involve areas of trauma like hands, elbows, knees, and ankles. There is no cure and it is difficult to treat.  Treatments include topical steroids and other topical anti-inflammatory ointments/creams and topical and oral Jak inhibitors.  Sometimes narrow band UV light therapy or Xtrac laser is helpful, both of which require twice weekly treatments for at least 3-6 months.  Antioxidant vitamins, such as Vitamins A,C,E,D, Folic Acid and B12 may be added to enhance treatment. Heliocare may also  enhance treatment results.   Treatment Plan: XTRAC laser treatment not covered by insurance Continue Opzelura cream qd if possible BID to aa hands, elbows, knees. Continue Clobetasol topical foam at least 3d/wk aa hands, elbows knees. Caution skin atrophy with long-term use.   Topical steroids (such as triamcinolone, fluocinolone, fluocinonide, mometasone, clobetasol, halobetasol, betamethasone, hydrocortisone) can cause thinning and lightening of the skin if they are used for too long in the same area. Your physician has selected the right strength medicine for your problem and area affected on the body. Please use your medication only as directed by your physician to prevent side effects.      Return in about 6 months (around 12/05/2023) for Vitiligo, w/ Dr. Roseanne Reno.  I, Soundra Pilon, CMA, am acting as scribe for Willeen Niece, MD .   Documentation: I have reviewed the above documentation for accuracy and completeness, and I agree with the above.  Willeen Niece, MD

## 2023-06-04 NOTE — Patient Instructions (Signed)

## 2023-09-06 ENCOUNTER — Encounter: Payer: Self-pay | Admitting: Urology

## 2023-10-05 ENCOUNTER — Other Ambulatory Visit: Payer: Self-pay | Admitting: Dermatology

## 2023-11-20 ENCOUNTER — Ambulatory Visit: Admitting: Urology

## 2023-11-21 ENCOUNTER — Ambulatory Visit: Admitting: Urology

## 2023-11-22 ENCOUNTER — Ambulatory Visit: Payer: Self-pay | Admitting: Urology

## 2023-12-03 ENCOUNTER — Ambulatory Visit (INDEPENDENT_AMBULATORY_CARE_PROVIDER_SITE_OTHER): Admitting: Dermatology

## 2023-12-03 DIAGNOSIS — L80 Vitiligo: Secondary | ICD-10-CM

## 2023-12-03 MED ORDER — CLOBETASOL PROPIONATE 0.05 % EX FOAM: CUTANEOUS | 3 refills | Status: DC

## 2023-12-03 NOTE — Progress Notes (Signed)
   Follow-Up Visit   Subjective  Jon Hamilton is a 28 y.o. male who presents for the following: Vitiligo 79m follow up, knees, wrists, feet, hands, arms, elbows using clobetasol  topical foam and opzelura  cream. He is using Opzelura  cream 4x/wk to daily and using clobetasol  foam 2-3 times a week. He hasn't been as consistent due to a new  week old baby boy.   The following portions of the chart were reviewed this encounter and updated as appropriate: medications, allergies, medical history  Review of Systems:  No other skin or systemic complaints except as noted in HPI or Assessment and Plan.  Objective  Well appearing patient in no apparent distress; mood and affect are within normal limits.  A focused examination was performed of the following areas: Face, hands, knees  Relevant physical exam findings are noted in the assessment and plan.    Assessment & Plan   VITILIGO   Related Medications clobetasol  (OLUX ) 0.05 % topical foam Apply to affected areas hands and knees 2-3 times a week. Avoid applying to face, groin, and axilla. Use as directed. Long-term use can cause thinning of the skin. VITILIGO Exam: depigmented patches on hands, knees, improvement of knees when compared to baseline photos, hands are stable.  Chronic and persistent condition with duration or expected duration over one year. Condition is improving with treatment but not currently at goal.   Vitiligo is a chronic autoimmune condition which causes loss of skin pigment and is commonly seen on the face and may also involve areas of trauma like hands, elbows, knees, and ankles. There is no cure and it is difficult to treat.  Treatments include topical steroids and other topical anti-inflammatory ointments/creams and topical and oral Jak inhibitors.  Sometimes narrow band UV light therapy or Xtrac laser is helpful, both of which require twice weekly treatments for at least 3-6 months.  Antioxidant vitamins, such as  Vitamins C,E,D, Folic Acid, B12, and alpha lipoic acid may be added to enhance treatment. Heliocare may also enhance treatment results.   Treatment Plan: Continue Opzelura  cream qd if possible BID to aa hands, elbows, knees. Continue Clobetasol  topical foam at least 3d/wk aa hands, elbows knees. Caution skin atrophy with long-term use.  Topical steroids (such as triamcinolone, fluocinolone, fluocinonide, mometasone, clobetasol , halobetasol, betamethasone, hydrocortisone) can cause thinning and lightening of the skin if they are used for too long in the same area. Your physician has selected the right strength medicine for your problem and area affected on the body. Please use your medication only as directed by your physician to prevent side effects.     Return in about 1 year (around 12/02/2024) for vitiligo.  IAndrea Kerns, CMA, am acting as scribe for Rexene Rattler, MD .   Documentation: I have reviewed the above documentation for accuracy and completeness, and I agree with the above.  Rexene Rattler, MD

## 2023-12-03 NOTE — Patient Instructions (Signed)
 Your prescription was sent to [facility] in [place]. A representative from [facility] will contact you within 3 business hours to verify your address and insurance information to schedule a free delivery. If for any reason you do not receive a phone call from them, please reach out to them. Their phone number is [phone] and their hours are Monday-Friday 9:00 am-5:00 pm.     Due to recent changes in healthcare laws, you may see results of your pathology and/or laboratory studies on MyChart before the doctors have had a chance to review them. We understand that in some cases there may be results that are confusing or concerning to you. Please understand that not all results are received at the same time and often the doctors may need to interpret multiple results in order to provide you with the best plan of care or course of treatment. Therefore, we ask that you please give us  2 business days to thoroughly review all your results before contacting the office for clarification. Should we see a critical lab result, you will be contacted sooner.   If You Need Anything After Your Visit  If you have any questions or concerns for your doctor, please call our main line at [phone] and press option 4 to reach your doctor's medical assistant. If no one answers, please leave a voicemail as directed and we will return your call as soon as possible. Messages left after 4 pm will be answered the following business day.   You may also send us  a message via MyChart. We typically respond to MyChart messages within 1-2 business days.  For prescription refills, please ask your pharmacy to contact our office. Our fax number is [phone].  If you have an urgent issue when the clinic is closed that cannot wait until the next business day, you can page your doctor at the number below.    Please note that while we do our best to be available for urgent issues outside of office hours, we are not  available 24/7.   If you have an urgent issue and are unable to reach us , you may choose to seek medical care at your doctor's office, retail clinic, urgent care center, or emergency room.  If you have a medical emergency, please immediately call 911 or go to the emergency department.  Pager Numbers  - Dr. Hester: [phone]  - Dr. Jackquline: [phone]  - Dr. Claudene: [phone]   - Dr. Raymund: [phone]  In the event of inclement weather, please call our main line at [phone] for an update on the status of any delays or closures.  Dermatology Medication Tips: Please keep the boxes that topical medications come in in order to help keep track of the instructions about where and how to use these. Pharmacies typically print the medication instructions only on the boxes and not directly on the medication tubes.   If your medication is too expensive, please contact our office at [phone] option 4 or send us  a message through MyChart.   We are unable to tell what your co-pay for medications will be in advance as this is different depending on your insurance coverage. However, we may be able to find a substitute medication at lower cost or fill out paperwork to get insurance to cover a needed medication.   If a prior authorization is required to get your medication covered by your insurance company, please allow us  1-2 business days to complete this process.  Drug prices often vary depending  on where the prescription is filled and some pharmacies may offer cheaper prices.  The website www.goodrx.com contains coupons for medications through different pharmacies. The prices here do not account for what the cost may be with help from insurance (it may be cheaper with your insurance), but the website can give you the price if you did not use any insurance.  - You can print the associated coupon and take it with your prescription to the pharmacy.  - You may also stop by our office  during regular business hours and pick up a GoodRx coupon card.  - If you need your prescription sent electronically to a different pharmacy, notify our office through [facility] or by phone at [phone] option 4.     Si Usted Necesita Algo Despus de Su Visita  Tambin puede enviarnos un mensaje a travs de Clinical cytogeneticist. Por lo general respondemos a los mensajes de MyChart en el transcurso de 1 a 2 das hbiles.  Para renovar recetas, por favor pida a su farmacia que se ponga en contacto con nuestra oficina. Randi lakes de fax es [place] [phone].  Si tiene un asunto urgente cuando la clnica est cerrada y que no puede esperar hasta el siguiente da hbil, puede llamar/localizar a su doctor(a) al nmero que aparece a continuacin.   Por favor, tenga en cuenta que aunque hacemos todo lo posible para estar disponibles para asuntos urgentes fuera del horario de [place], no estamos disponibles las 24 horas del da, los 7 [address]  [address] de la [place].   Si tiene un problema urgente y no puede comunicarse con nosotros, puede optar por buscar atencin mdica  en el consultorio de su doctor(a), en una clnica privada, en un centro de atencin urgente o en una sala de emergencias.  Si tiene Engineer, drilling, por favor llame inmediatamente al 911 o vaya a la sala de emergencias.  Nmeros de bper  - Dr. Hester: [phone]  - Dra. Jackquline: [phone]  - Dr. Claudene: [phone]  - Dra. Kitts: [phone]  En caso de inclemencias del [place], por favor llame a nuestra lnea principal al [phone] para una actualizacin sobre el estado de cualquier retraso o cierre.  Consejos para la medicacin en dermatologa: Por favor, guarde las cajas en las que vienen los medicamentos de uso tpico para ayudarle a seguir las instrucciones sobre dnde y cmo usarlos. Las farmacias generalmente imprimen las instrucciones del medicamento slo en las cajas y no directamente en los tubos del  [place].   Si su medicamento es muy caro, por favor, pngase en contacto con landry rieger llamando al [phone] y presione la opcin 4 o envenos un mensaje a travs de Clinical cytogeneticist.   No podemos decirle cul ser su copago por los medicamentos por adelantado ya que esto es diferente dependiendo de la cobertura de su seguro. Sin embargo, es posible que podamos encontrar un medicamento sustituto a Audiological scientist un formulario para que el seguro cubra el medicamento que se considera necesario.   Si se requiere una autorizacin previa para que su compaa de seguros malta su medicamento, por favor permtanos de 1 a 2 das hbiles para completar este proceso.  Los precios de los medicamentos varan con frecuencia dependiendo del Environmental consultant de dnde se surte la receta y alguna farmacias pueden ofrecer precios ms baratos.  El sitio web www.goodrx.com tiene cupones para medicamentos de Health and safety inspector. Los precios aqu no tienen en cuenta lo que podra costar con la ayuda del seguro (puede ser ms  barato con su seguro), pero el sitio web puede darle el precio si no Visual merchandiser.  - Puede imprimir el cupn correspondiente y llevarlo con su receta a la farmacia.  - Tambin puede pasar por nuestra oficina durante el horario de atencin regular y Education officer, museum una tarjeta de cupones de GoodRx.  - Si necesita que su receta se enve electrnicamente a una farmacia diferente, informe a nuestra oficina a travs de MyChart de [place] o por telfono llamando al [phone] y presione la opcin 4.

## 2023-12-11 ENCOUNTER — Ambulatory Visit: Admitting: Urology

## 2023-12-11 ENCOUNTER — Encounter: Payer: Self-pay | Admitting: Urology

## 2023-12-11 VITALS — BP 115/80 | HR 85 | Ht 70.0 in | Wt 215.0 lb

## 2023-12-11 DIAGNOSIS — N5082 Scrotal pain: Secondary | ICD-10-CM | POA: Diagnosis not present

## 2023-12-11 DIAGNOSIS — Z3009 Encounter for other general counseling and advice on contraception: Secondary | ICD-10-CM | POA: Diagnosis not present

## 2023-12-11 NOTE — Progress Notes (Signed)
 12/11/2023 2:03 PM   [name] 1995/04/17 [id number]  Referring provider: Cyrus Selinda Moose, PA-C [address] [place],  [place] [address]  Chief Complaint  Patient presents with   Testicle Pain    HPI: Jon Hamilton is a 28 y.o. male who presents for annual follow-up.  Initially seen 04/28/2022 for post ejaculatory scrotal pain which resolved with tamsulosin  He states he subsequently lost weight and has not needed tamsulosin  after taking it for 1-2 months His wife just delivered and he is interested in vasectomy in the near future.   PMH: History reviewed. No pertinent past medical history.  Surgical History: History reviewed. No pertinent surgical history.  Home Medications:  Allergies as of 12/11/2023       Reactions   Penicillins Hives        Medication List        Accurate as of December 11, 2023  2:03 PM. If you have any questions, ask your nurse or doctor.          STOP taking these medications    clobetasol  0.05 % topical foam Commonly known as: OLUX  Stopped by: Aaiden Depoy C Tayshun Gappa   Opzelura  1.5 % Crea Generic drug: Ruxolitinib Phosphate  Stopped by: Glendia JAYSON Barba   tamsulosin  0.4 MG Caps capsule Commonly known as: FLOMAX  Stopped by: Glendia JAYSON Barba        Allergies:  Allergies  Allergen Reactions   Penicillins Hives    Family History: History reviewed. No pertinent family history.  Social History:  reports that he has never smoked. He has never used smokeless tobacco. No history on file for alcohol use and drug use.   Physical Exam: BP 115/80   Pulse 85   Ht 5' 10 (1.778 m)   Wt 215 lb (97.5 kg)   BMI 30.85 kg/m   Constitutional:  Alert, No acute distress. HEENT: [place] AT Respiratory: Normal respiratory effort, no increased work of breathing. Psychiatric: Normal mood and affect.   Assessment & Plan:    1.  Scrotal pain Resolved  2.  Vasectomy counseling We had a long discussion about vasectomy. We  specifically discussed the procedure, recovery and the risks, benefits and alternatives of vasectomy. I explained that the procedure entails removal of a segment of each vas deferens, each of which conducts sperm, and that the purpose of this procedure is to cause sterility (inability to produce children or cause pregnancy). Vasectomy is intended to be permanent and irreversible form of contraception. Options for fertility after vasectomy include vasectomy reversal, or sperm retrieval with in vitro fertilization. These options are not always successful, and they may be expensive. We discussed reversible forms of birth control such as condoms, IUD or diaphragms, as well as the option of freezing sperm in a sperm bank prior to the vasectomy procedure. We discussed the importance of avoiding strenuous exercise for four days after vasectomy, and the importance of refraining from any form of ejaculation for seven days after vasectomy. I explained that vasectomy does not produce immediate sterility so another form of contraceptive must be used until sterility is assured by having semen checked for sperm. Thus, a post vasectomy semen analysis is necessary to confirm sterility. Rarely, vasectomy must be repeated. We discussed the approximately 1 in 2,000 risk of pregnancy after vasectomy for men who have post-vasectomy semen analysis showing absent sperm or rare non-motile sperm. Typical side effects include a small amount of oozing blood, some discomfort and mild swelling in the area of incision.  Vasectomy does not affect sexual performance, function, please, sensation, interest, desire, satisfaction, penile erection, volume of semen or ejaculation. Other rare risks include allergy or adverse reaction to an anesthetic, testicular atrophy, hematoma, infection/abscess, prolonged tenderness of the vas deferens, pain, swelling, painful nodule or scar (called sperm granuloma) or epididymtis. We discussed chronic testicular  pain syndrome. This has been reported to occur in as many as 1-2% of men and may be permanent. This can be treated with medication, small procedures or (rarely) surgery. He will call back if he desires to schedule  Glendia JAYSON Barba, MD  [facility] [address], [address] [place], [place] [address] [phone]

## 2023-12-16 ENCOUNTER — Encounter: Payer: Self-pay | Admitting: Urology

## 2024-12-02 ENCOUNTER — Ambulatory Visit: Admitting: Dermatology
